# Patient Record
Sex: Female | Born: 1950 | Race: White | Hispanic: No | Marital: Married | Smoking: Never smoker
Health system: Southern US, Community
[De-identification: ages and names within clinical notes are randomized; demographics above are authoritative.]

## PROBLEM LIST (undated history)

## (undated) DIAGNOSIS — K5790 Diverticulosis of intestine, part unspecified, without perforation or abscess without bleeding: Secondary | ICD-10-CM

## (undated) DIAGNOSIS — K5792 Diverticulitis of intestine, part unspecified, without perforation or abscess without bleeding: Secondary | ICD-10-CM

## (undated) DIAGNOSIS — K219 Gastro-esophageal reflux disease without esophagitis: Secondary | ICD-10-CM

## (undated) DIAGNOSIS — G500 Trigeminal neuralgia: Secondary | ICD-10-CM

## (undated) DIAGNOSIS — K829 Disease of gallbladder, unspecified: Secondary | ICD-10-CM

## (undated) DIAGNOSIS — E669 Obesity, unspecified: Secondary | ICD-10-CM

## (undated) HISTORY — PX: TUBAL LIGATION: SHX77

---

## 2005-12-13 ENCOUNTER — Ambulatory Visit: Payer: Self-pay

## 2006-02-28 ENCOUNTER — Ambulatory Visit: Payer: Self-pay | Admitting: Unknown Physician Specialty

## 2006-12-16 ENCOUNTER — Ambulatory Visit: Payer: Self-pay

## 2007-12-20 ENCOUNTER — Ambulatory Visit: Payer: Self-pay

## 2008-12-25 ENCOUNTER — Ambulatory Visit: Payer: Self-pay

## 2009-11-08 ENCOUNTER — Emergency Department: Payer: Self-pay | Admitting: Emergency Medicine

## 2010-01-14 ENCOUNTER — Ambulatory Visit: Payer: Self-pay

## 2011-01-20 ENCOUNTER — Ambulatory Visit: Payer: Self-pay

## 2012-01-26 ENCOUNTER — Ambulatory Visit: Payer: Self-pay

## 2013-02-14 ENCOUNTER — Ambulatory Visit: Payer: Self-pay

## 2014-03-25 ENCOUNTER — Ambulatory Visit: Payer: Self-pay

## 2016-05-04 ENCOUNTER — Other Ambulatory Visit: Payer: Self-pay | Admitting: Obstetrics and Gynecology

## 2016-05-04 DIAGNOSIS — Z1231 Encounter for screening mammogram for malignant neoplasm of breast: Secondary | ICD-10-CM

## 2016-05-21 ENCOUNTER — Ambulatory Visit: Payer: Self-pay

## 2016-06-16 ENCOUNTER — Other Ambulatory Visit: Payer: Self-pay | Admitting: Obstetrics and Gynecology

## 2016-06-16 ENCOUNTER — Ambulatory Visit
Admission: RE | Admit: 2016-06-16 | Discharge: 2016-06-16 | Disposition: A | Discharge status: Home / Self Care | Payer: BC Managed Care – PPO | Source: Ambulatory Visit | Attending: Obstetrics and Gynecology | Admitting: Obstetrics and Gynecology

## 2016-06-16 DIAGNOSIS — Z1231 Encounter for screening mammogram for malignant neoplasm of breast: Secondary | ICD-10-CM | POA: Diagnosis not present

## 2017-06-06 ENCOUNTER — Encounter (HOSPITAL_COMMUNITY): Payer: Self-pay | Admitting: Emergency Medicine

## 2017-06-06 DIAGNOSIS — K449 Diaphragmatic hernia without obstruction or gangrene: Secondary | ICD-10-CM | POA: Diagnosis not present

## 2017-06-06 DIAGNOSIS — K828 Other specified diseases of gallbladder: Secondary | ICD-10-CM | POA: Diagnosis not present

## 2017-06-06 DIAGNOSIS — N281 Cyst of kidney, acquired: Secondary | ICD-10-CM | POA: Insufficient documentation

## 2017-06-06 DIAGNOSIS — K5730 Diverticulosis of large intestine without perforation or abscess without bleeding: Secondary | ICD-10-CM | POA: Diagnosis not present

## 2017-06-06 DIAGNOSIS — Z79899 Other long term (current) drug therapy: Secondary | ICD-10-CM | POA: Diagnosis not present

## 2017-06-06 DIAGNOSIS — Z6841 Body Mass Index (BMI) 40.0 and over, adult: Secondary | ICD-10-CM | POA: Insufficient documentation

## 2017-06-06 DIAGNOSIS — R509 Fever, unspecified: Secondary | ICD-10-CM | POA: Insufficient documentation

## 2017-06-06 DIAGNOSIS — G500 Trigeminal neuralgia: Secondary | ICD-10-CM | POA: Insufficient documentation

## 2017-06-06 DIAGNOSIS — I700 Atherosclerosis of aorta: Secondary | ICD-10-CM | POA: Diagnosis not present

## 2017-06-06 DIAGNOSIS — K219 Gastro-esophageal reflux disease without esophagitis: Secondary | ICD-10-CM | POA: Diagnosis not present

## 2017-06-06 DIAGNOSIS — E669 Obesity, unspecified: Secondary | ICD-10-CM | POA: Diagnosis not present

## 2017-06-06 DIAGNOSIS — K5712 Diverticulitis of small intestine without perforation or abscess without bleeding: Secondary | ICD-10-CM | POA: Diagnosis not present

## 2017-06-06 DIAGNOSIS — M4316 Spondylolisthesis, lumbar region: Secondary | ICD-10-CM | POA: Insufficient documentation

## 2017-06-06 LAB — CBC
HEMATOCRIT: 37.4 % (ref 36.0–46.0)
HEMOGLOBIN: 12.5 g/dL (ref 12.0–15.0)
MCH: 29.5 pg (ref 26.0–34.0)
MCHC: 33.4 g/dL (ref 30.0–36.0)
MCV: 88.2 fL (ref 78.0–100.0)
Platelets: 314 10*3/uL (ref 150–400)
RBC: 4.24 MIL/uL (ref 3.87–5.11)
RDW: 13.8 % (ref 11.5–15.5)
WBC: 14.4 10*3/uL — AB (ref 4.0–10.5)

## 2017-06-06 LAB — COMPREHENSIVE METABOLIC PANEL
ALT: 13 U/L — ABNORMAL LOW (ref 14–54)
ANION GAP: 6 (ref 5–15)
AST: 17 U/L (ref 15–41)
Albumin: 3.4 g/dL — ABNORMAL LOW (ref 3.5–5.0)
Alkaline Phosphatase: 68 U/L (ref 38–126)
BILIRUBIN TOTAL: 0.5 mg/dL (ref 0.3–1.2)
BUN: 11 mg/dL (ref 6–20)
CO2: 27 mmol/L (ref 22–32)
Calcium: 9.1 mg/dL (ref 8.9–10.3)
Chloride: 103 mmol/L (ref 101–111)
Creatinine, Ser: 0.92 mg/dL (ref 0.44–1.00)
GFR calc Af Amer: >60 mL/min (ref >60)
Glucose, Bld: 131 mg/dL — ABNORMAL HIGH (ref 65–99)
POTASSIUM: 4.4 mmol/L (ref 3.5–5.1)
Sodium: 136 mmol/L (ref 135–145)
TOTAL PROTEIN: 6.6 g/dL (ref 6.5–8.1)

## 2017-06-06 LAB — URINALYSIS, ROUTINE W REFLEX MICROSCOPIC
Bilirubin Urine: NEGATIVE
Glucose, UA: NEGATIVE mg/dL
Hgb urine dipstick: NEGATIVE
KETONES UR: NEGATIVE mg/dL
LEUKOCYTES UA: NEGATIVE
NITRITE: NEGATIVE
PH: 7 (ref 5.0–8.0)
Protein, ur: NEGATIVE mg/dL
SPECIFIC GRAVITY, URINE: 1.011 (ref 1.005–1.030)

## 2017-06-06 LAB — LIPASE, BLOOD: LIPASE: 24 U/L (ref 11–51)

## 2017-06-06 MED ORDER — ACETAMINOPHEN 325 MG PO TABS
ORAL_TABLET | ORAL | Status: AC
  Filled 2017-06-06: qty 2

## 2017-06-06 MED ORDER — ACETAMINOPHEN 325 MG PO TABS
650.0000 mg | ORAL_TABLET | Freq: Once | ORAL | Status: AC
  Administered 2017-06-06: 650 mg via ORAL

## 2017-06-06 NOTE — ED Triage Notes (Signed)
Patient reports RUQ pain onset this week with emesis , diagnosed with gallbladder [facility] at [facility] , febrile at arrival , denies diarrhea .

## 2017-06-07 ENCOUNTER — Emergency Department (HOSPITAL_COMMUNITY): Payer: BC Managed Care – PPO

## 2017-06-07 ENCOUNTER — Encounter (HOSPITAL_COMMUNITY): Payer: Self-pay | Admitting: Internal Medicine

## 2017-06-07 ENCOUNTER — Observation Stay (HOSPITAL_COMMUNITY)
Admission: EM | Admit: 2017-06-07 | Discharge: 2017-06-08 | Disposition: A | Discharge status: Home / Self Care | Payer: BC Managed Care – PPO | Attending: Family Medicine | Admitting: Family Medicine

## 2017-06-07 DIAGNOSIS — R509 Fever, unspecified: Secondary | ICD-10-CM | POA: Diagnosis not present

## 2017-06-07 DIAGNOSIS — G500 Trigeminal neuralgia: Secondary | ICD-10-CM

## 2017-06-07 DIAGNOSIS — K5712 Diverticulitis of small intestine without perforation or abscess without bleeding: Secondary | ICD-10-CM

## 2017-06-07 DIAGNOSIS — K5792 Diverticulitis of intestine, part unspecified, without perforation or abscess without bleeding: Secondary | ICD-10-CM | POA: Diagnosis present

## 2017-06-07 DIAGNOSIS — E669 Obesity, unspecified: Secondary | ICD-10-CM | POA: Diagnosis present

## 2017-06-07 DIAGNOSIS — K5790 Diverticulosis of intestine, part unspecified, without perforation or abscess without bleeding: Secondary | ICD-10-CM | POA: Diagnosis present

## 2017-06-07 DIAGNOSIS — K829 Disease of gallbladder, unspecified: Secondary | ICD-10-CM | POA: Diagnosis present

## 2017-06-07 DIAGNOSIS — K219 Gastro-esophageal reflux disease without esophagitis: Secondary | ICD-10-CM | POA: Diagnosis present

## 2017-06-07 HISTORY — DX: Diverticulosis of intestine, part unspecified, without perforation or abscess without bleeding: K57.90

## 2017-06-07 HISTORY — DX: Obesity, unspecified: E66.9

## 2017-06-07 HISTORY — DX: Disease of gallbladder, unspecified: K82.9

## 2017-06-07 HISTORY — DX: Diverticulitis of intestine, part unspecified, without perforation or abscess without bleeding: K57.92

## 2017-06-07 HISTORY — DX: Gastro-esophageal reflux disease without esophagitis: K21.9

## 2017-06-07 HISTORY — DX: Trigeminal neuralgia: G50.0

## 2017-06-07 LAB — HIV ANTIBODY (ROUTINE TESTING W REFLEX): HIV SCREEN 4TH GENERATION: NONREACTIVE

## 2017-06-07 MED ORDER — LATANOPROST 0.005 % OP SOLN
1.0000 [drp] | Freq: Every day | OPHTHALMIC | Status: DC
  Administered 2017-06-07: 1 [drp] via OPHTHALMIC
  Filled 2017-06-07: qty 2.5

## 2017-06-07 MED ORDER — SODIUM CHLORIDE 0.9 % IV SOLN
INTRAVENOUS | Status: DC
  Administered 2017-06-07: 07:00:00 via INTRAVENOUS

## 2017-06-07 MED ORDER — CARBAMAZEPINE 200 MG PO TABS
200.0000 mg | ORAL_TABLET | Freq: Every evening | ORAL | Status: DC
  Administered 2017-06-07: 200 mg via ORAL
  Filled 2017-06-07: qty 1

## 2017-06-07 MED ORDER — ONDANSETRON HCL 4 MG/2ML IJ SOLN: 4.0000 mg | Freq: Four times a day (QID) | INTRAMUSCULAR | Status: DC | PRN

## 2017-06-07 MED ORDER — SODIUM CHLORIDE 0.9 % IV BOLUS (SEPSIS)
1000.0000 mL | Freq: Once | INTRAVENOUS | Status: AC
  Administered 2017-06-07: 1000 mL via INTRAVENOUS

## 2017-06-07 MED ORDER — KETOROLAC TROMETHAMINE 15 MG/ML IJ SOLN: 15.0000 mg | Freq: Four times a day (QID) | INTRAMUSCULAR | Status: DC | PRN

## 2017-06-07 MED ORDER — CIPROFLOXACIN IN D5W 400 MG/200ML IV SOLN: 400.0000 mg | Freq: Once | INTRAVENOUS | Status: DC

## 2017-06-07 MED ORDER — OXYCODONE HCL 5 MG PO TABS: 5.0000 mg | ORAL_TABLET | ORAL | Status: DC | PRN

## 2017-06-07 MED ORDER — METRONIDAZOLE IN NACL 5-0.79 MG/ML-% IV SOLN
500.0000 mg | Freq: Once | INTRAVENOUS | Status: AC
  Administered 2017-06-07: 500 mg via INTRAVENOUS
  Filled 2017-06-07: qty 100

## 2017-06-07 MED ORDER — DEXTROSE 5 % IV SOLN
2.0000 g | Freq: Once | INTRAVENOUS | Status: AC
  Administered 2017-06-07: 2 g via INTRAVENOUS
  Filled 2017-06-07: qty 2

## 2017-06-07 MED ORDER — METRONIDAZOLE IN NACL 5-0.79 MG/ML-% IV SOLN
500.0000 mg | Freq: Three times a day (TID) | INTRAVENOUS | Status: DC
  Administered 2017-06-07 – 2017-06-08 (×4): 500 mg via INTRAVENOUS
  Filled 2017-06-07 (×6): qty 100

## 2017-06-07 MED ORDER — ONDANSETRON HCL 4 MG/2ML IJ SOLN
4.0000 mg | Freq: Once | INTRAMUSCULAR | Status: AC
  Administered 2017-06-07: 4 mg via INTRAVENOUS
  Filled 2017-06-07: qty 2

## 2017-06-07 MED ORDER — FENTANYL CITRATE (PF) 100 MCG/2ML IJ SOLN
50.0000 ug | Freq: Once | INTRAMUSCULAR | Status: AC
  Administered 2017-06-07: 50 ug via INTRAVENOUS
  Filled 2017-06-07: qty 2

## 2017-06-07 MED ORDER — ENOXAPARIN SODIUM 40 MG/0.4ML ~~LOC~~ SOLN
40.0000 mg | SUBCUTANEOUS | Status: DC
  Administered 2017-06-07 – 2017-06-08 (×2): 40 mg via SUBCUTANEOUS
  Filled 2017-06-07 (×2): qty 0.4

## 2017-06-07 MED ORDER — IOPAMIDOL (ISOVUE-300) INJECTION 61%
INTRAVENOUS | Status: AC
  Administered 2017-06-07: 100 mL
  Filled 2017-06-07: qty 100

## 2017-06-07 MED ORDER — ACETAMINOPHEN 325 MG PO TABS
650.0000 mg | ORAL_TABLET | Freq: Four times a day (QID) | ORAL | Status: DC | PRN
  Administered 2017-06-07 (×2): 650 mg via ORAL
  Filled 2017-06-07 (×2): qty 2

## 2017-06-07 MED ORDER — SODIUM CHLORIDE 0.9 % IV BOLUS (SEPSIS)
1000.0000 mL | Freq: Once | INTRAVENOUS | Status: AC
Start: 2017-06-07 — End: 2017-06-07
  Administered 2017-06-07: 1000 mL via INTRAVENOUS

## 2017-06-07 MED ORDER — DEXTROSE 5 % IV SOLN
2.0000 g | INTRAVENOUS | Status: DC
  Administered 2017-06-07: 2 g via INTRAVENOUS
  Filled 2017-06-07: qty 2

## 2017-06-07 MED ORDER — ONDANSETRON HCL 4 MG PO TABS: 4.0000 mg | ORAL_TABLET | Freq: Four times a day (QID) | ORAL | Status: DC | PRN

## 2017-06-07 NOTE — Plan of Care (Signed)
Received signout from Dr. Lynelle DoctorKnapp. Ms. Joy Mclaughlin is a 66 year old female with pmh of gallbladder disease; who presented with complaints of abd pain, nausea, vomiting, and fever 101.72F. WBC 14.4. CT scan of the abdomen showing diverticulitis of the small bowel. Patient initially given Rocephin and metronidazole IV. Ciprofloxacin added on following CT scan results. Patient also received 2 L of normal saline IV fluids, Zofran, Tylenol, and fentanyl for pain. Admitted as observation to a MedSurg bed.

## 2017-06-07 NOTE — Progress Notes (Signed)
Pharmacy Antibiotic Note  Joy JohnConnie Mclaughlin is a 66 y.o. female admitted on 06/07/2017 with intra-abdominal infection.  Pharmacy has been consulted for Ceftriaxone dosing. WBC elevated.   Plan: -Ceftriaxone 2g IV q24h -Flagyl per MD -Trend WBC, temp -F/U infectious work-up  Height: 5\' 2"  (157.5 cm) Weight: 227 lb 8.2 oz (103.2 kg) IBW/kg (Calculated) : 50.1  Temp (24hrs), Avg:100.8 F (38.2 C), Min:99.7 F (37.6 C), Max:101.7 F (38.7 C)   Recent Labs Lab 06/06/17 2002  WBC 14.4*  CREATININE 0.92    Estimated Creatinine Clearance: 70.4 mL/min (by C-G formula based on SCr of 0.92 mg/dL).    No Known Allergies  Joy Mclaughlin, Joy Mclaughlin 06/07/2017 6:58 AM

## 2017-06-07 NOTE — H&P (Signed)
History and Physical    Joy Mclaughlin ZOX:[id number] DOB: [birth year] DOA: 06/07/2017  PCP: [facility]) Patient [place]  Chief Complaint: abdominal pain  HPI: Joy Mclaughlin is a very pleasant 65 y.o. female with medical history significant GERD, trigeminal neuralgia, diverticulosis, gall bladder disease this emergency department chief complaint persistent worsening abdominal pain. Initial evaluation includes CT of the abdomen pelvis revealing diverticulitis.  Information is obtained from the patient and the chart. She states she was in her usual state of health just returning from a [address] trip vacation she developed sudden right upper quadrant pain. She was evaluated at [facility] and told that there was mild sludge in her gallbladder. She was provided with oral antibiotics and discharged. MRI with MRCP scheduled for tomorrow. He was also instructed that if she spikes a temperature we'll refer pain got worse she needed to go to a bigger hospital. She said yesterday she was very tired she ate very little around 6 PM became feverish and had a chill. She reports her temperature was 101.9 associated symptoms include nausea without vomiting. She reports initially the pain was in her upper quadrants by the time she came to emergency department it was more in her lower quadrants. She describes a constant sharp aching. Nothing makes it better or worse. She denies headache dizziness syncope or near-syncope. She denies chest pain palpitations shortness of breath diaphoresis cough. She denies dysuria hematuria frequency or urgency.    ED Course: In the emergency department her max temperature is 100.9 she's hemodynamically stable and not hypoxic. She is provided with 3 L of normal saline Cipro and Flagyl and Rocephin. At the time of my exam she reports no nausea and is pain-free  Review of Systems: As per HPI otherwise all other systems reviewed and are  negative.   Ambulatory Status: Ambulates independently. Is independent with ADLs  Past Medical History:  Diagnosis Date  . Diverticulitis   . Diverticulosis   . Gallbladder disease   . GERD (gastroesophageal reflux disease)   . Obesity   . Trigeminal neuralgia     Past Surgical History:  Procedure Laterality Date  . TUBAL LIGATION      Social History   Social History  . Marital status: Married    Spouse name: N/A  . Number of children: N/A  . Years of education: N/A   Occupational History  . Not on file.   Social History Main Topics  . Smoking status: Never Smoker  . Smokeless tobacco: Not on file  . Alcohol use No  . Drug use: No  . Sexual activity: Not on file   Other Topics Concern  . Not on file   Social History Narrative  . No narrative on file    No Known Allergies  Family History  Problem Relation Age of Onset  . Asthma Mother   . Diabetes Mother   . Hypertension Mother   . Hypertension Father     Prior to Admission medications   Medication Sig Start Date End Date Taking? Authorizing Provider  carbamazepine (TEGRETOL) 200 MG tablet Take 200 mg by mouth every evening.   Yes [provider]  Coenzyme Q10 (COQ10) 200 MG CAPS Take 200 mg by mouth every Tuesday, Thursday, and Saturday at 6 PM.   Yes [provider]  Cyanocobalamin (VITAMIN B-12 PO) Take 1 tablet by mouth daily.   Yes [provider]  latanoprost (XALATAN) 0.005 % ophthalmic solution Place 1  drop into both eyes at bedtime.   Yes [provider]  Multiple Vitamin (MULTIVITAMIN WITH MINERALS) TABS tablet Take 1 tablet by mouth every Monday, Wednesday, and Friday.   Yes [provider]  omega-3 acid ethyl esters (LOVAZA) 1 g capsule Take 1 g by mouth every Monday, Wednesday, and Friday.   Yes [provider]  omeprazole (PRILOSEC) 20 MG capsule Take 20 mg by mouth daily.   Yes [provider]  oxyCODONE (OXY IR/ROXICODONE) 5 MG  immediate release tablet Take 5 mg by mouth every 4 (four) hours as needed for moderate pain.  06/06/17  Yes [provider]  Red Yeast Rice Extract (RED YEAST RICE PO) Take 1,200 mg by mouth every Tuesday, Thursday, and Saturday at 6 PM.   Yes [provider]    Physical Exam: Vitals:   06/07/17 0345 06/07/17 0430 06/07/17 0545 06/07/17 0611  BP: 129/62 (!) 141/74 (!) 154/76 (!) 123/49  Pulse: 83 79 77 81  Resp:  18 18 18   Temp:    (!) 100.9 F (38.3 C)  TempSrc:    Oral  SpO2: 92% 94% 93% 99%  Weight:    103.2 kg (227 lb 8.2 oz)  Height:    5\' 2"  (1.575 m)     General:  Appears calm and comfortable In no acute distress Eyes:  PERRL, EOMI, normal lids, iris ENT:  grossly normal hearing, lips & tongue, mucous membranes of her mouth are somewhat dry but pink Neck:  no LAD, masses or thyromegaly Cardiovascular:  RRR, no m/r/g. No LE edema.  Respiratory:  CTA bilaterally, no w/r/r. Normal respiratory effort. Abdomen:  Soft, mild diffuse tenderness particularly in the lower quadrants no guarding or rebounding bowel sounds very sluggish particularly in the lower quadrants Skin:  no rash or induration seen on limited exam Musculoskeletal:  grossly normal tone BUE/BLE, good ROM, no bony abnormality Psychiatric:  grossly normal mood and affect, speech fluent and appropriate, AOx3 Neurologic:  CN 2-12 grossly intact, moves all extremities in coordinated fashion, sensation intact  Labs on Admission: I have personally reviewed following labs and imaging studies  CBC:  Recent Labs Lab 06/06/17 2002  WBC 14.4*  HGB 12.5  HCT 37.4  MCV 88.2  PLT 314   Basic Metabolic Panel:  Recent Labs Lab 06/06/17 2002  NA 136  K 4.4  CL 103  CO2 27  GLUCOSE 131*  BUN 11  CREATININE 0.92  CALCIUM 9.1   GFR: Estimated Creatinine Clearance: 70.4 mL/min (by C-G formula based on SCr of 0.92 mg/dL). Liver Function Tests:  Recent Labs Lab 06/06/17 2002  AST 17  ALT 13*   ALKPHOS 68  BILITOT 0.5  PROT 6.6  ALBUMIN 3.4*    Recent Labs Lab 06/06/17 2002  LIPASE 24   No results for input(s): AMMONIA in the last 168 hours. Coagulation Profile: No results for input(s): INR, PROTIME in the last 168 hours. Cardiac Enzymes: No results for input(s): CKTOTAL, CKMB, CKMBINDEX, TROPONINI in the last 168 hours. BNP (last 3 results) No results for input(s): PROBNP in the last 8760 hours. HbA1C: No results for input(s): HGBA1C in the last 72 hours. CBG: No results for input(s): GLUCAP in the last 168 hours. Lipid Profile: No results for input(s): CHOL, HDL, LDLCALC, TRIG, CHOLHDL, LDLDIRECT in the last 72 hours. Thyroid Function Tests: No results for input(s): TSH, T4TOTAL, FREET4, T3FREE, THYROIDAB in the last 72 hours. Anemia Panel: No results for input(s): VITAMINB12, FOLATE, FERRITIN, TIBC, IRON,  RETICCTPCT in the last 72 hours. Urine analysis:    Component Value Date/Time   COLORURINE YELLOW 06/06/2017 2000   APPEARANCEUR CLEAR 06/06/2017 2000   LABSPEC 1.011 06/06/2017 2000   PHURINE 7.0 06/06/2017 2000   GLUCOSEU NEGATIVE 06/06/2017 2000   HGBUR NEGATIVE 06/06/2017 2000   BILIRUBINUR NEGATIVE 06/06/2017 2000   KETONESUR NEGATIVE 06/06/2017 2000   PROTEINUR NEGATIVE 06/06/2017 2000   NITRITE NEGATIVE 06/06/2017 2000   LEUKOCYTESUR NEGATIVE 06/06/2017 2000    Creatinine Clearance: Estimated Creatinine Clearance: 70.4 mL/min (by C-G formula based on SCr of 0.92 mg/dL).  Sepsis Labs: @LABRCNTIP (procalcitonin:4,lacticidven:4) )No results found for this or any previous visit (from the past 240 hour(s)).   Radiological Exams on Admission: Ct Abdomen Pelvis W Contrast  Result Date: 06/07/2017 CLINICAL DATA:  65 year old female with right-sided abdominal pain and vomiting. EXAM: CT ABDOMEN AND PELVIS WITH CONTRAST TECHNIQUE: Multidetector CT imaging of the abdomen and pelvis was performed using the standard protocol following bolus  administration of intravenous contrast. CONTRAST:  ISOVUE-300 IOPAMIDOL (ISOVUE-300) INJECTION 61% COMPARISON:  None. FINDINGS: Lower chest: The visualized lung bases are clear. No intra-abdominal free air or free fluid. Hepatobiliary: The liver is unremarkable. The gallbladder is distended. No pericholecystic fluid. No calcified gallstone. Pancreas: Unremarkable. No pancreatic ductal dilatation or surrounding inflammatory changes. Spleen: Normal in size without focal abnormality. Adrenals/Urinary Tract: The adrenal glands are unremarkable. There are small bilateral renal parapelvic cysts. There is no hydronephrosis on either side. There is symmetric enhancement and excretion of contrast by kidneys. The visualized ureters and urinary bladder appear unremarkable. Stomach/Bowel: There is a moderate size hiatal hernia. There are multiple duodenal diverticulum measuring up to 2.2 cm at the head of the pancreas no active inflammatory changes. There is inflammatory changes of the loops of small bowel in the mid abdomen. Multiple small bowel diverticula noted in this inflamed loops of small bowel. The inflammatory changes appear to arise from a small bowel diverticulum in the mid abdomen (series 3, image 54 and coronal series 6, image 51). There are scattered colonic diverticula without active inflammatory changes. There is no evidence of bowel obstruction. The appendix is normal. Vascular/Lymphatic: There is mild atherosclerotic calcification of the abdominal aorta. The origins of the celiac axis, SMA, and IMA appear patent. No portal venous gas identified. There is no adenopathy. The IVC appears unremarkable. Reproductive: The uterus is anteverted and grossly unremarkable as visualized. No pelvic mass. Other: None Musculoskeletal: There is degenerative changes of the spine with multilevel disc desiccation and vacuum phenomena. Grade 1 L4-5 anterolisthesis. No acute fracture. IMPRESSION: 1. Findings most consistent  with diverticulitis of the small bowel in the mid abdomen. No drainable fluid collection or abscess. 2. Colonic diverticulosis without active inflammatory changes. No bowel obstruction. Normal appendix. 3. Moderate size hiatal hernia. 4. Mildly distended gallbladder.  No calcified gallstone. Electronically Signed   By: Elgie Collard M.D.   On: 06/07/2017 02:47   US Abdomen Limited Ruq  Result Date: 06/07/2017 CLINICAL DATA:  66 year old female with right upper quadrant abdominal pain. EXAM: ULTRASOUND ABDOMEN LIMITED RIGHT UPPER QUADRANT COMPARISON:  Abdominal CT dated 06/07/2017 FINDINGS: Gallbladder: No gallstones or wall thickening visualized. No sonographic Murphy sign noted by sonographer. Common bile duct: Diameter: 3 mm Liver: No focal lesion identified. Within normal limits in parenchymal echogenicity. IMPRESSION: Unremarkable right upper quadrant ultrasound. Electronically Signed   By: Elgie Collard M.D.   On: 06/07/2017 03:10    EKG:   Assessment/Plan Principal Problem:   Diverticulitis Active  Problems:   Obesity   Diverticulosis   Gallbladder disease   GERD (gastroesophageal reflux disease)   #1. Diverticulitis. CT of the abdomen findings consistent with diverticulitis of small bowel no drainable fluid collection or abscess, no bowel obstruction mildly distended gallbladder no calcified gallstones. She has a fever, WBC 14.4. Ultrasound of right upper quadrant unremarkable. Patient provided with IV fluids and antibiotics in the emergency department -Continue Cipro and Flagyl -Clear liquid diet -Analgesia and antiemetic -Gentle IV fluids -Hopefully home in 24 hours on oral antibiotics  #2. Gallbladder disease/abnormal abdominal ultrasound at another facility. Patient had a right upper [facility] that revealed mild sludge. She was scheduled for a MRI with MRCP for tomorrow outpatient. Right upper quadrant ultrasound done today was  unremarkable. -Outpatient follow-up  #3. GERD. Stable at baseline -continue home meds    DVT prophylaxis: lovenox  Code Status: full  Family Communication: none present  Disposition Plan: home hopefully tomorrow  Consults called: none  Admission status: obs    Gwenyth BenderBLACK,Amylah Will M MD [facility]  If 7PM-7AM, please contact night-coverage [email or web address] Password [email or web address]  06/07/2017, 7:33 AM

## 2017-06-07 NOTE — ED Provider Notes (Signed)
[facility]-EMERGENCY DEPT Provider Note   CSN: [id number] Arrival date & time: 06/06/17  1933  By signing my name below, I, Diona BrownerJennifer Gorman, attest that this documentation has been prepared under the direction and in the presence of Devoria AlbeKnapp, Sherwin Hollingshed, MD. Electronically Signed: Diona BrownerJennifer Gorman, ED Scribe. 06/07/17. 1:08 AM.  TIME SEEN: [id number]  History   Chief Complaint Chief Complaint  Patient presents with  . Abdominal Pain    Gallbladder Disease    HPI Joy Mclaughlin is a 66 y.o. female with a PMHx of gallbladder disease, who presents to the Emergency Department complaining of RUQ pain that started ~7:30 pm on Sunday, 06/05/17. Pt reports pain started after eating a pot pie and ice cream. Associated sx include fever (101.9), emesis (yesterday), chills, and fatigue. Pain has improved with pain medication, but is still present and radiates to her right back. Pt went to [facility] when onset first started and was Was violated with an ultrasound which showed gallbladder sludge. Her white blood cell count was 14,000. She was discharged home on July 30 with pain medication and MRI MRCP scheduled for Saturday, 06/11/17. She was told that if her sx worsened or if she became febrile to report to a bigger hospital. Pt slept a lot today, ate a few bites of a salad and noticed a temperature ~ 6 pm of 101.9. Both parents have had their gallbladders removed. Doesn't smoke. Drinks occasionally. Pt denies abdominal bloating, and diarrhea. She denies nausea or vomiting today although she did have them when her symptoms first started. She denies cough or shortness of breath. She states she did not eat today.  PCP: Clydie BraunFitzgerald, David [facility]([place])   The history is provided by the patient. No language interpreter was used.    Past Medical History:  Diagnosis Date  . Gallbladder disease   . Trigeminal neuralgia     There are no active problems to display for this patient.   Past Surgical History:  Procedure  Laterality Date  . TUBAL LIGATION      OB History    No data available       Home Medications    Prior to Admission medications   Medication Sig Start Date End Date Taking? Authorizing Provider  carbamazepine (TEGRETOL) 200 MG tablet Take 200 mg by mouth every evening.   Yes [provider]  Coenzyme Q10 (COQ10) 200 MG CAPS Take 200 mg by mouth every Tuesday, Thursday, and Saturday at 6 PM.   Yes [provider]  Cyanocobalamin (VITAMIN B-12 PO) Take 1 tablet by mouth daily.   Yes [provider]  latanoprost (XALATAN) 0.005 % ophthalmic solution Place 1 drop into both eyes at bedtime.   Yes [provider]  Multiple Vitamin (MULTIVITAMIN WITH MINERALS) TABS tablet Take 1 tablet by mouth every Monday, Wednesday, and Friday.   Yes [provider]  omega-3 acid ethyl esters (LOVAZA) 1 g capsule Take 1 g by mouth every Monday, Wednesday, and Friday.   Yes [provider]  omeprazole (PRILOSEC) 20 MG capsule Take 20 mg by mouth daily.   Yes [provider]  oxyCODONE (OXY IR/ROXICODONE) 5 MG immediate release tablet Take 5 mg by mouth every 4 (four) hours as needed for moderate pain.  06/06/17  Yes [provider]  Red Yeast Rice Extract (RED YEAST RICE PO) Take 1,200 mg by mouth every Tuesday, Thursday, and Saturday at 6 PM.   Yes [provider]    Family History No family history  on file.  Social History Social History  Substance Use Topics  . Smoking status: Never Smoker  . Smokeless tobacco: Not on file  . Alcohol use No  retired   Allergies   Patient has no known allergies.   Review of Systems Review of Systems  Constitutional: Positive for chills, fatigue and fever.  Gastrointestinal: Positive for abdominal pain and vomiting. Negative for diarrhea.  All other systems reviewed and are negative.    Physical Exam Updated Vital Signs BP 133/66 (BP Location: Right Arm)   Pulse 91   Temp  (!) 101.7 F (38.7 C) (Oral)   Resp 17   Ht 5\' 2"  (1.575 m)   Wt 218 lb (98.9 kg)   SpO2 96%   BMI 39.87 kg/m   Vital signs normal    Physical Exam  Constitutional: She is oriented to person, place, and time. She appears well-developed and well-nourished.  Non-toxic appearance. She does not appear ill. No distress.  HENT:  Head: Normocephalic and atraumatic.  Right Ear: External ear normal.  Left Ear: External ear normal.  Nose: Nose normal. No mucosal edema or rhinorrhea.  Mouth/Throat: Oropharynx is clear and moist and mucous membranes are normal. No dental abscesses or uvula swelling.  Eyes: Pupils are equal, round, and reactive to light. Conjunctivae and EOM are normal.  Neck: Normal range of motion and full passive range of motion without pain. Neck supple.  Cardiovascular: Normal rate, regular rhythm and normal heart sounds.  Exam reveals no gallop and no friction rub.   No murmur heard. Pulmonary/Chest: Effort normal and breath sounds normal. No respiratory distress. She has no wheezes. She has no rhonchi. She has no rales. She exhibits no tenderness and no crepitus.  Abdominal: Soft. Normal appearance and bowel sounds are normal. She exhibits no distension. There is tenderness in the right lower quadrant. There is no rebound and no guarding.    RLQ and right mid abdomen tenderness. She does not have tenderness in the right upper quadrant.  Musculoskeletal: Normal range of motion. She exhibits no edema or tenderness.  Moves all extremities well.   Neurological: She is alert and oriented to person, place, and time. She has normal strength. No cranial nerve deficit.  Skin: Skin is warm, dry and intact. No rash noted. No erythema. No pallor.  Psychiatric: She has a normal mood and affect. Her speech is normal and behavior is normal. Her mood appears not anxious.  Nursing note and vitals reviewed.    ED Treatments / Results  Labs (all labs ordered are listed, but only  abnormal results are displayed) Results for orders placed or performed during the hospital encounter of 06/07/17  Lipase, blood  Result Value Ref Range   Lipase 24 11 - 51 U/L  Comprehensive metabolic panel  Result Value Ref Range   Sodium 136 135 - 145 mmol/L   Potassium 4.4 3.5 - 5.1 mmol/L   Chloride 103 101 - 111 mmol/L   CO2 27 22 - 32 mmol/L   Glucose, Bld 131 (H) 65 - 99 mg/dL   BUN 11 6 - 20 mg/dL   Creatinine, Ser 4.09 0.44 - 1.00 mg/dL   Calcium 9.1 8.9 - 81.1 mg/dL   Total Protein 6.6 6.5 - 8.1 g/dL   Albumin 3.4 (L) 3.5 - 5.0 g/dL   AST 17 15 - 41 U/L   ALT 13 (L) 14 - 54 U/L   Alkaline Phosphatase 68 38 - 126 U/L   Total Bilirubin 0.5 0.3 -  1.2 mg/dL   GFR calc non Af Amer >60 >60 mL/min   GFR calc Af Amer >60 >60 mL/min   Anion gap 6 5 - 15  CBC  Result Value Ref Range   WBC 14.4 (H) 4.0 - 10.5 K/uL   RBC 4.24 3.87 - 5.11 MIL/uL   Hemoglobin 12.5 12.0 - 15.0 g/dL   HCT 40.9 81.1 - 91.4 %   MCV 88.2 78.0 - 100.0 fL   MCH 29.5 26.0 - 34.0 pg   MCHC 33.4 30.0 - 36.0 g/dL   RDW 78.2 95.6 - 21.3 %   Platelets 314 150 - 400 K/uL  Urinalysis, Routine w reflex microscopic  Result Value Ref Range   Color, Urine YELLOW YELLOW   APPearance CLEAR CLEAR   Specific Gravity, Urine 1.011 1.005 - 1.030   pH 7.0 5.0 - 8.0   Glucose, UA NEGATIVE NEGATIVE mg/dL   Hgb urine dipstick NEGATIVE NEGATIVE   Bilirubin Urine NEGATIVE NEGATIVE   Ketones, ur NEGATIVE NEGATIVE mg/dL   Protein, ur NEGATIVE NEGATIVE mg/dL   Nitrite NEGATIVE NEGATIVE   Leukocytes, UA NEGATIVE NEGATIVE   Laboratory interpretation all normal except For leukocytosis    EKG  EKG Interpretation None       Radiology Ct Abdomen Pelvis W Contrast  Result Date: 06/07/2017 CLINICAL DATA:  66 year old female with right-sided abdominal pain and vomiting. EXAM: CT ABDOMEN AND PELVIS WITH CONTRAST TECHNIQUE: Multidetector CT imaging of the abdomen and pelvis was performed using the standard protocol  following bolus administration of intravenous contrast. CONTRAST:  ISOVUE-300 IOPAMIDOL (ISOVUE-300) INJECTION 61% COMPARISON:  None. FINDINGS: Lower chest: The visualized lung bases are clear. No intra-abdominal free air or free fluid. Hepatobiliary: The liver is unremarkable. The gallbladder is distended. No pericholecystic fluid. No calcified gallstone. Pancreas: Unremarkable. No pancreatic ductal dilatation or surrounding inflammatory changes. Spleen: Normal in size without focal abnormality. Adrenals/Urinary Tract: The adrenal glands are unremarkable. There are small bilateral renal parapelvic cysts. There is no hydronephrosis on either side. There is symmetric enhancement and excretion of contrast by kidneys. The visualized ureters and urinary bladder appear unremarkable. Stomach/Bowel: There is a moderate size hiatal hernia. There are multiple duodenal diverticulum measuring up to 2.2 cm at the head of the pancreas no active inflammatory changes. There is inflammatory changes of the loops of small bowel in the mid abdomen. Multiple small bowel diverticula noted in this inflamed loops of small bowel. The inflammatory changes appear to arise from a small bowel diverticulum in the mid abdomen (series 3, image 54 and coronal series 6, image 51). There are scattered colonic diverticula without active inflammatory changes. There is no evidence of bowel obstruction. The appendix is normal. Vascular/Lymphatic: There is mild atherosclerotic calcification of the abdominal aorta. The origins of the celiac axis, SMA, and IMA appear patent. No portal venous gas identified. There is no adenopathy. The IVC appears unremarkable. Reproductive: The uterus is anteverted and grossly unremarkable as visualized. No pelvic mass. Other: None Musculoskeletal: There is degenerative changes of the spine with multilevel disc desiccation and vacuum phenomena. Grade 1 L4-5 anterolisthesis. No acute fracture. IMPRESSION: 1. Findings  most consistent with diverticulitis of the small bowel in the mid abdomen. No drainable fluid collection or abscess. 2. Colonic diverticulosis without active inflammatory changes. No bowel obstruction. Normal appendix. 3. Moderate size hiatal hernia. 4. Mildly distended gallbladder.  No calcified gallstone. Electronically Signed   By: Elgie Collard M.D.   On: 06/07/2017 02:47   US Abdomen Limited Ruq  Result Date: 06/07/2017 CLINICAL DATA:  66 year old female with right upper quadrant abdominal pain. EXAM: ULTRASOUND ABDOMEN LIMITED RIGHT UPPER QUADRANT COMPARISON:  Abdominal CT dated 06/07/2017 FINDINGS: Gallbladder: No gallstones or wall thickening visualized. No sonographic Murphy sign noted by sonographer. Common bile duct: Diameter: 3 mm Liver: No focal lesion identified. Within normal limits in parenchymal echogenicity. IMPRESSION: Unremarkable right upper quadrant ultrasound. Electronically Signed   By: Elgie CollardArash  Radparvar M.D.   On: 06/07/2017 03:10    Procedures Procedures (including critical care time)  Medications Ordered in ED Medications  acetaminophen (TYLENOL) 325 MG tablet (not administered)  ciprofloxacin (CIPRO) IVPB 400 mg (not administered)  acetaminophen (TYLENOL) tablet 650 mg (650 mg Oral Given 06/06/17 2007)  sodium chloride 0.9 % bolus 1,000 mL (0 mLs Intravenous Stopped 06/07/17 0329)  sodium chloride 0.9 % bolus 1,000 mL (1,000 mLs Intravenous New Bag/Given 06/07/17 0126)  fentaNYL (SUBLIMAZE) injection 50 mcg (50 mcg Intravenous Given 06/07/17 0125)  ondansetron (ZOFRAN) injection 4 mg (4 mg Intravenous Given 06/07/17 0123)  cefTRIAXone (ROCEPHIN) 2 g in dextrose 5 % 50 mL IVPB (0 g Intravenous Stopped 06/07/17 0230)    And  metroNIDAZOLE (FLAGYL) IVPB 500 mg (0 mg Intravenous Stopped 06/07/17 0226)  iopamidol (ISOVUE-300) 61 % injection (100 mLs  Contrast Given 06/07/17 0208)     Initial Impression / Assessment and Plan / ED Course  I have reviewed the triage vital  signs and the nursing notes.  Pertinent labs & imaging results that were available during my care of the patient were reviewed by me and considered in my medical decision making (see chart for details).  Clinical Course as of Jun 07 434  Tue Jun 07, 2017  0356 Pt is feeling better. Gallbladder test looked good. Pt has diverticulitis in her small bowel. We discussed pt being admitted to the hospital for at least 24 hours or going home as outpatient with oral antibiotics. She prefers to be admitted. Pt reports her mother has diverticulitis.   [JG]    Clinical Course User Index [JG] Diona BrownerGorman, Jennifer     DIAGNOSTIC STUDIES: Oxygen Saturation is 96% on RA, adequate by my interpretation.   COORDINATION OF CARE: 1:08 AM-Discussed next steps with pt which includes repeating an ultra sound and a Abdominal/pelvis CT scan. Pt will also be given antibiotics and IV fluids. Pt verbalized understanding and is agreeable with the plan. Patient's pain seems to be in the wrong location to be from her gallbladder, concern was for appendicitis or other type of bowel inflammation. She was given IV antibiotics for simple appendicitis while waiting for her radiology test results  04:34 AM Dr Arlyss Queen Smith, hospitalist, will admit.   Final Clinical Impressions(s) / ED Diagnoses   Final diagnoses:  Diverticulitis of small intestine without perforation or abscess without bleeding    Plan admission  Devoria AlbeIva Janashia Parco, MD, FACEP    I personally performed the services described in this documentation, which was scribed in my presence. The recorded information has been reviewed and considered.  Vital signs normal     Devoria AlbeKnapp, Shantese Raven, MD 06/07/17 [phone]

## 2017-06-08 DIAGNOSIS — K5792 Diverticulitis of intestine, part unspecified, without perforation or abscess without bleeding: Secondary | ICD-10-CM

## 2017-06-08 DIAGNOSIS — K829 Disease of gallbladder, unspecified: Secondary | ICD-10-CM | POA: Diagnosis not present

## 2017-06-08 DIAGNOSIS — K219 Gastro-esophageal reflux disease without esophagitis: Secondary | ICD-10-CM | POA: Diagnosis not present

## 2017-06-08 DIAGNOSIS — K5790 Diverticulosis of intestine, part unspecified, without perforation or abscess without bleeding: Secondary | ICD-10-CM

## 2017-06-08 DIAGNOSIS — G500 Trigeminal neuralgia: Secondary | ICD-10-CM | POA: Diagnosis not present

## 2017-06-08 LAB — COMPREHENSIVE METABOLIC PANEL
ALK PHOS: 54 U/L (ref 38–126)
ALT: 12 U/L — ABNORMAL LOW (ref 14–54)
ANION GAP: 7 (ref 5–15)
AST: 13 U/L — ABNORMAL LOW (ref 15–41)
Albumin: 2.5 g/dL — ABNORMAL LOW (ref 3.5–5.0)
BUN: 6 mg/dL (ref 6–20)
CALCIUM: 8.1 mg/dL — AB (ref 8.9–10.3)
CO2: 23 mmol/L (ref 22–32)
Chloride: 109 mmol/L (ref 101–111)
Creatinine, Ser: 0.81 mg/dL (ref 0.44–1.00)
GFR calc Af Amer: >60 mL/min (ref >60)
Glucose, Bld: 101 mg/dL — ABNORMAL HIGH (ref 65–99)
Potassium: 3.4 mmol/L — ABNORMAL LOW (ref 3.5–5.1)
SODIUM: 139 mmol/L (ref 135–145)
Total Bilirubin: 0.3 mg/dL (ref 0.3–1.2)
Total Protein: 5.7 g/dL — ABNORMAL LOW (ref 6.5–8.1)

## 2017-06-08 LAB — CBC
HCT: 32.5 % — ABNORMAL LOW (ref 36.0–46.0)
HEMOGLOBIN: 10.6 g/dL — AB (ref 12.0–15.0)
MCH: 28.8 pg (ref 26.0–34.0)
MCHC: 32.6 g/dL (ref 30.0–36.0)
MCV: 88.3 fL (ref 78.0–100.0)
Platelets: 237 10*3/uL (ref 150–400)
RBC: 3.68 MIL/uL — ABNORMAL LOW (ref 3.87–5.11)
RDW: 13.9 % (ref 11.5–15.5)
WBC: 8.5 10*3/uL (ref 4.0–10.5)

## 2017-06-08 MED ORDER — DOCUSATE SODIUM 100 MG PO CAPS: 100.0000 mg | ORAL_CAPSULE | Freq: Two times a day (BID) | ORAL | 0 refills | Status: AC

## 2017-06-08 MED ORDER — METRONIDAZOLE 500 MG PO TABS: 500.0000 mg | ORAL_TABLET | Freq: Three times a day (TID) | ORAL | 0 refills | Status: AC

## 2017-06-08 MED ORDER — POTASSIUM CHLORIDE CRYS ER 20 MEQ PO TBCR
40.0000 meq | EXTENDED_RELEASE_TABLET | Freq: Once | ORAL | Status: AC
  Administered 2017-06-08: 40 meq via ORAL
  Filled 2017-06-08: qty 2

## 2017-06-08 MED ORDER — CIPROFLOXACIN HCL 500 MG PO TABS: 500.0000 mg | ORAL_TABLET | Freq: Two times a day (BID) | ORAL | 0 refills | Status: AC

## 2017-06-08 NOTE — Discharge Instructions (Signed)
Follow with Primary MD  System, Pcp Not In  and other consultant's as instructed your Hospitalist MD ° °Please get a complete blood count and chemistry panel checked by your Primary MD at your next visit, and again as instructed by your Primary MD. ° °Get Medicines reviewed and adjusted: °Please take all your medications with you for your next visit with your Primary MD ° °Laboratory/radiological data: °Please request your Primary MD to go over all hospital tests and procedure/radiological results at the follow up, please ask your Primary MD to get all Hospital records sent to his/her office. ° °In some cases, they will be blood work, cultures and biopsy results pending at the time of your discharge. Please request that your primary care M.D. follows up on these results. ° °Also Note the following: °If you experience worsening of your admission symptoms, develop shortness of breath, life threatening emergency, suicidal or homicidal thoughts you must seek medical attention immediately by calling 911 or calling your MD immediately  if symptoms less severe. ° °You must read complete instructions/literature along with all the possible adverse reactions/side effects for all the Medicines you take and that have been prescribed to you. Take any new Medicines after you have completely understood and accpet all the possible adverse reactions/side effects.  ° °Do not drive when taking Pain medications or sleeping medications (Benzodaizepines) ° °Do not take more than prescribed Pain, Sleep and Anxiety Medications. It is not advisable to combine anxiety,sleep and pain medications without talking with your primary care practitioner ° °Special Instructions: If you have smoked or chewed Tobacco  in the last 2 yrs please stop smoking, stop any regular Alcohol  and or any Recreational drug use. ° °Wear Seat belts while driving. ° °Please note: °You were cared for by a hospitalist during your hospital stay. Once you are discharged,  your primary care physician will handle any further medical issues. Please note that NO REFILLS for any discharge medications will be authorized once you are discharged, as it is imperative that you return to your primary care physician (or establish a relationship with a primary care physician if you do not have one) for your post hospital discharge needs so that they can reassess your need for medications and monitor your lab values. ° ° ° ° °

## 2017-06-08 NOTE — Discharge Summary (Signed)
Physician Discharge Summary  Joy JohnConnie Janosik WGN:[id number] DOB: January 25, 1951 DOA: 06/07/2017  PCP: System, Pcp Not In  Admit date: 06/07/2017 Discharge date: 06/08/2017  Admitted From: HOME  Disposition: HOME   Recommendations for Outpatient Follow-up:  1. Follow up with PCP in 1 weeks 2. Please obtain BMP/CBC in one week 3. Go to scheduled testing for MRCP as previously arranged.   Home Health: N/A  Discharge Condition: STABLE  CODE STATUS: FULL    Brief Hospitalization Summary: Please see all hospital notes, images, labs for full details of the hospitalization.  HPI: Joy Mclaughlin is a very pleasant 66 y.o. female with medical history significant GERD, trigeminal neuralgia, diverticulosis, gall bladder disease this emergency department chief complaint persistent worsening abdominal pain. Initial evaluation includes CT of the abdomen pelvis revealing diverticulitis.  Information is obtained from the patient and the chart. She states she was in her usual state of health just returning from a [address] trip vacation she developed sudden right upper quadrant pain. She was evaluated at [facility] and told that there was mild sludge in her gallbladder. She was provided with oral antibiotics and discharged. MRI with MRCP scheduled for tomorrow. He was also instructed that if she spikes a temperature we'll refer pain got worse she needed to go to a bigger hospital. She said yesterday she was very tired she ate very little around 6 PM became feverish and had a chill. She reports her temperature was 101.9 associated symptoms include nausea without vomiting. She reports initially the pain was in her upper quadrants by the time she came to emergency department it was more in her lower quadrants. She describes a constant sharp aching. Nothing makes it better or worse. She denies headache dizziness syncope or near-syncope. She denies chest pain palpitations shortness of breath diaphoresis cough. She  denies dysuria hematuria frequency or urgency.  ED Course: In the emergency department her max temperature is 100.9 she's hemodynamically stable and not hypoxic. She is provided with 3 L of normal saline Cipro and Flagyl and Rocephin. At the time of my exam she reports no nausea and is pain-free  #1. Diverticulitis. CT of the abdomen findings consistent with diverticulitis of small bowel no drainable fluid collection or abscess, no bowel obstruction mildly distended gallbladder no calcified gallstones. She has a fever, WBC 14.4. Ultrasound of right upper quadrant unremarkable. Patient provided with IV fluids and antibiotics with good results and improvement in symptoms -Discharge on oral  Cipro and Flagyl to complete 10 days course -soft diet recommended and written information given to patient regarding this -regular stool softeners recommended  #2. Gallbladder disease/abnormal abdominal ultrasound at another facility. Patient had a right upper quadrant ultrasound yesterday at [facility] that revealed mild sludge. She was scheduled for a MRI with MRCP outpatient today which she was encouraged to go to or follow up with PCP to have rescheduled. Right upper quadrant ultrasound done in hospital was unremarkable. -Outpatient follow-up  #3. GERD. Stable at baseline -continue home meds  DVT prophylaxis: lovenox  Code Status: full  Family Communication: none present  Disposition Plan: home   Consults called: none  Admission status: obs   Discharge Diagnoses:  Principal Problem:   Diverticulitis Active Problems:   Obesity   Diverticulosis   Gallbladder disease   GERD (gastroesophageal reflux disease)   Trigeminal neuralgia   Fever  Discharge Instructions: Discharge Instructions    Call MD for:  extreme fatigue    Complete by:  As directed  Call MD for:  persistant dizziness or light-headedness    Complete by:  As directed    Call MD for:  persistant nausea and  vomiting    Complete by:  As directed    Call MD for:  severe uncontrolled pain    Complete by:  As directed    Call MD for:  temperature >100.4    Complete by:  As directed    Increase activity slowly    Complete by:  As directed      Allergies as of 06/08/2017   No Known Allergies     Medication List    TAKE these medications   carbamazepine 200 MG tablet Commonly known as:  TEGRETOL Take 200 mg by mouth every evening.   ciprofloxacin 500 MG tablet Commonly known as:  CIPRO Take 1 tablet (500 mg total) by mouth 2 (two) times daily.   CoQ10 200 MG Caps Take 200 mg by mouth every Tuesday, Thursday, and Saturday at 6 PM.   docusate sodium 100 MG capsule Commonly known as:  COLACE Take 1 capsule (100 mg total) by mouth 2 (two) times daily.   latanoprost 0.005 % ophthalmic solution Commonly known as:  XALATAN Place 1 drop into both eyes at bedtime.   metroNIDAZOLE 500 MG tablet Commonly known as:  FLAGYL Take 1 tablet (500 mg total) by mouth 3 (three) times daily.   multivitamin with minerals Tabs tablet Take 1 tablet by mouth every Monday, Wednesday, and Friday.   omega-3 acid ethyl esters 1 g capsule Commonly known as:  LOVAZA Take 1 g by mouth every Monday, Wednesday, and Friday.   omeprazole 20 MG capsule Commonly known as:  PRILOSEC Take 20 mg by mouth daily.   oxyCODONE 5 MG immediate release tablet Commonly known as:  Oxy IR/ROXICODONE Take 5 mg by mouth every 4 (four) hours as needed for moderate pain.   RED YEAST RICE PO Take 1,200 mg by mouth every Tuesday, Thursday, and Saturday at 6 PM.   VITAMIN B-12 PO Take 1 tablet by mouth daily.       No Known Allergies Current Discharge Medication List    START taking these medications   Details  ciprofloxacin (CIPRO) 500 MG tablet Take 1 tablet (500 mg total) by mouth 2 (two) times daily. Qty: 18 tablet, Refills: 0    docusate sodium (COLACE) 100 MG capsule Take 1 capsule (100 mg total) by mouth 2  (two) times daily. Qty: 100 capsule, Refills: 0    metroNIDAZOLE (FLAGYL) 500 MG tablet Take 1 tablet (500 mg total) by mouth 3 (three) times daily. Qty: 27 tablet, Refills: 0      CONTINUE these medications which have NOT CHANGED   Details  carbamazepine (TEGRETOL) 200 MG tablet Take 200 mg by mouth every evening.    Coenzyme Q10 (COQ10) 200 MG CAPS Take 200 mg by mouth every Tuesday, Thursday, and Saturday at 6 PM.    Cyanocobalamin (VITAMIN B-12 PO) Take 1 tablet by mouth daily.    latanoprost (XALATAN) 0.005 % ophthalmic solution Place 1 drop into both eyes at bedtime.    Multiple Vitamin (MULTIVITAMIN WITH MINERALS) TABS tablet Take 1 tablet by mouth every Monday, Wednesday, and Friday.    omega-3 acid ethyl esters (LOVAZA) 1 g capsule Take 1 g by mouth every Monday, Wednesday, and Friday.    omeprazole (PRILOSEC) 20 MG capsule Take 20 mg by mouth daily.    oxyCODONE (OXY IR/ROXICODONE) 5 MG immediate release tablet Take 5  mg by mouth every 4 (four) hours as needed for moderate pain.     Red Yeast Rice Extract (RED YEAST RICE PO) Take 1,200 mg by mouth every Tuesday, Thursday, and Saturday at 6 PM.        Procedures/Studies: Ct Abdomen Pelvis W Contrast  Result Date: 06/07/2017 CLINICAL DATA:  66 year old female with right-sided abdominal pain and vomiting. EXAM: CT ABDOMEN AND PELVIS WITH CONTRAST TECHNIQUE: Multidetector CT imaging of the abdomen and pelvis was performed using the standard protocol following bolus administration of intravenous contrast. CONTRAST:  ISOVUE-300 IOPAMIDOL (ISOVUE-300) INJECTION 61% COMPARISON:  None. FINDINGS: Lower chest: The visualized lung bases are clear. No intra-abdominal free air or free fluid. Hepatobiliary: The liver is unremarkable. The gallbladder is distended. No pericholecystic fluid. No calcified gallstone. Pancreas: Unremarkable. No pancreatic ductal dilatation or surrounding inflammatory changes. Spleen: Normal in size  without focal abnormality. Adrenals/Urinary Tract: The adrenal glands are unremarkable. There are small bilateral renal parapelvic cysts. There is no hydronephrosis on either side. There is symmetric enhancement and excretion of contrast by kidneys. The visualized ureters and urinary bladder appear unremarkable. Stomach/Bowel: There is a moderate size hiatal hernia. There are multiple duodenal diverticulum measuring up to 2.2 cm at the head of the pancreas no active inflammatory changes. There is inflammatory changes of the loops of small bowel in the mid abdomen. Multiple small bowel diverticula noted in this inflamed loops of small bowel. The inflammatory changes appear to arise from a small bowel diverticulum in the mid abdomen (series 3, image 54 and coronal series 6, image 51). There are scattered colonic diverticula without active inflammatory changes. There is no evidence of bowel obstruction. The appendix is normal. Vascular/Lymphatic: There is mild atherosclerotic calcification of the abdominal aorta. The origins of the celiac axis, SMA, and IMA appear patent. No portal venous gas identified. There is no adenopathy. The IVC appears unremarkable. Reproductive: The uterus is anteverted and grossly unremarkable as visualized. No pelvic mass. Other: None Musculoskeletal: There is degenerative changes of the spine with multilevel disc desiccation and vacuum phenomena. Grade 1 L4-5 anterolisthesis. No acute fracture. IMPRESSION: 1. Findings most consistent with diverticulitis of the small bowel in the mid abdomen. No drainable fluid collection or abscess. 2. Colonic diverticulosis without active inflammatory changes. No bowel obstruction. Normal appendix. 3. Moderate size hiatal hernia. 4. Mildly distended gallbladder.  No calcified gallstone. Electronically Signed   By: Elgie Collard M.D.   On: 06/07/2017 02:47   US Abdomen Limited Ruq  Result Date: 06/07/2017 CLINICAL DATA:  66 year old female with right  upper quadrant abdominal pain. EXAM: ULTRASOUND ABDOMEN LIMITED RIGHT UPPER QUADRANT COMPARISON:  Abdominal CT dated 06/07/2017 FINDINGS: Gallbladder: No gallstones or wall thickening visualized. No sonographic Murphy sign noted by sonographer. Common bile duct: Diameter: 3 mm Liver: No focal lesion identified. Within normal limits in parenchymal echogenicity. IMPRESSION: Unremarkable right upper quadrant ultrasound. Electronically Signed   By: Elgie Collard M.D.   On: 06/07/2017 03:10      Subjective: Pt feels much better, no fever, tolerating diet, no abd pain.  No chills.   Discharge Exam: Vitals:   06/07/17 2156 06/08/17 0520  BP: (!) 111/48 (!) 104/54  Pulse: 71 69  Resp: 18 17  Temp: 98.6 F (37 C) 98.5 F (36.9 C)   Vitals:   06/07/17 0611 06/07/17 1330 06/07/17 2156 06/08/17 0520  BP: (!) 123/49 (!) 105/50 (!) 111/48 (!) 104/54  Pulse: 81 75 71 69  Resp: 18 18 18  17  Temp: (!) 100.9 F (38.3 C) 99.7 F (37.6 C) 98.6 F (37 C) 98.5 F (36.9 C)  TempSrc: Oral Oral Oral   SpO2: 99% 96% 95% 96%  Weight: 103.2 kg (227 lb 8.2 oz)     Height: 5\' 2"  (1.575 m)       General: Pt is alert, awake, not in acute distress Cardiovascular: RRR, S1/S2 +, no rubs, no gallops Respiratory: CTA bilaterally, no wheezing, no rhonchi Abdominal: Soft, NT, ND, bowel sounds + Extremities: no edema, no cyanosis   The results of significant diagnostics from this hospitalization (including imaging, microbiology, ancillary and laboratory) are listed below for reference.     Microbiology: No results found for this or any previous visit (from the past 240 hour(s)).   Labs: BNP (last 3 results) No results for input(s): BNP in the last 8760 hours. Basic Metabolic Panel:  Recent Labs Lab 06/06/17 2002 06/08/17 0538  NA 136 139  K 4.4 3.4*  CL 103 109  CO2 27 23  GLUCOSE 131* 101*  BUN 11 6  CREATININE 0.92 0.81  CALCIUM 9.1 8.1*   Liver Function Tests:  Recent Labs Lab  06/06/17 2002 06/08/17 0538  AST 17 13*  ALT 13* 12*  ALKPHOS 68 54  BILITOT 0.5 0.3  PROT 6.6 5.7*  ALBUMIN 3.4* 2.5*    Recent Labs Lab 06/06/17 2002  LIPASE 24   No results for input(s): AMMONIA in the last 168 hours. CBC:  Recent Labs Lab 06/06/17 2002 06/08/17 0538  WBC 14.4* 8.5  HGB 12.5 10.6*  HCT 37.4 32.5*  MCV 88.2 88.3  PLT 314 237   Cardiac Enzymes: No results for input(s): CKTOTAL, CKMB, CKMBINDEX, TROPONINI in the last 168 hours. BNP: Invalid input(s): POCBNP CBG: No results for input(s): GLUCAP in the last 168 hours. D-Dimer No results for input(s): DDIMER in the last 72 hours. Hgb A1c No results for input(s): HGBA1C in the last 72 hours. Lipid Profile No results for input(s): CHOL, HDL, LDLCALC, TRIG, CHOLHDL, LDLDIRECT in the last 72 hours. Thyroid function studies No results for input(s): TSH, T4TOTAL, T3FREE, THYROIDAB in the last 72 hours.  Invalid input(s): FREET3 Anemia work up No results for input(s): VITAMINB12, FOLATE, FERRITIN, TIBC, IRON, RETICCTPCT in the last 72 hours. Urinalysis    Component Value Date/Time   COLORURINE YELLOW 06/06/2017 2000   APPEARANCEUR CLEAR 06/06/2017 2000   LABSPEC 1.011 06/06/2017 2000   PHURINE 7.0 06/06/2017 2000   GLUCOSEU NEGATIVE 06/06/2017 2000   HGBUR NEGATIVE 06/06/2017 2000   BILIRUBINUR NEGATIVE 06/06/2017 2000   KETONESUR NEGATIVE 06/06/2017 2000   PROTEINUR NEGATIVE 06/06/2017 2000   NITRITE NEGATIVE 06/06/2017 2000   LEUKOCYTESUR NEGATIVE 06/06/2017 2000   Sepsis Labs Invalid input(s): PROCALCITONIN,  WBC,  LACTICIDVEN Microbiology No results found for this or any previous visit (from the past 240 hour(s)).  Time coordinating discharge:   SIGNED:  Standley Dakins, MD  [facility] 06/08/2017, 10:58 AM Pager [phone]  If 7PM-7AM, please contact night-coverage [email or web address] Password [email or web address]

## 2017-06-08 NOTE — Progress Notes (Signed)
Discharge home. Home discharge instruction given, no question verbalized. 

## 2017-06-28 ENCOUNTER — Other Ambulatory Visit: Payer: Self-pay | Admitting: Obstetrics and Gynecology

## 2017-06-28 DIAGNOSIS — Z1231 Encounter for screening mammogram for malignant neoplasm of breast: Secondary | ICD-10-CM

## 2017-07-15 ENCOUNTER — Ambulatory Visit
Admission: RE | Admit: 2017-07-15 | Discharge: 2017-07-15 | Disposition: A | Discharge status: Home / Self Care | Payer: BC Managed Care – PPO | Source: Ambulatory Visit | Attending: Obstetrics and Gynecology | Admitting: Obstetrics and Gynecology

## 2017-07-15 ENCOUNTER — Encounter: Payer: Self-pay | Admitting: Radiology

## 2017-07-15 DIAGNOSIS — Z1231 Encounter for screening mammogram for malignant neoplasm of breast: Secondary | ICD-10-CM | POA: Diagnosis not present

## 2018-06-29 ENCOUNTER — Other Ambulatory Visit: Payer: Self-pay | Admitting: Obstetrics and Gynecology

## 2018-06-29 DIAGNOSIS — Z1231 Encounter for screening mammogram for malignant neoplasm of breast: Secondary | ICD-10-CM

## 2018-08-01 ENCOUNTER — Ambulatory Visit
Admission: RE | Admit: 2018-08-01 | Discharge: 2018-08-01 | Disposition: A | Discharge status: Home / Self Care | Payer: BC Managed Care – PPO | Source: Ambulatory Visit | Attending: Obstetrics and Gynecology | Admitting: Obstetrics and Gynecology

## 2018-08-01 DIAGNOSIS — Z1231 Encounter for screening mammogram for malignant neoplasm of breast: Secondary | ICD-10-CM | POA: Diagnosis not present

## 2019-09-04 ENCOUNTER — Other Ambulatory Visit: Payer: Self-pay | Admitting: Obstetrics and Gynecology

## 2019-09-04 DIAGNOSIS — Z1231 Encounter for screening mammogram for malignant neoplasm of breast: Secondary | ICD-10-CM

## 2019-11-28 ENCOUNTER — Ambulatory Visit
Admission: RE | Admit: 2019-11-28 | Discharge: 2019-11-28 | Disposition: A | Discharge status: Home / Self Care | Payer: Medicare PPO | Source: Ambulatory Visit | Attending: Obstetrics and Gynecology | Admitting: Obstetrics and Gynecology

## 2019-11-28 DIAGNOSIS — Z1231 Encounter for screening mammogram for malignant neoplasm of breast: Secondary | ICD-10-CM | POA: Diagnosis present

## 2020-12-18 ENCOUNTER — Other Ambulatory Visit: Payer: Self-pay | Admitting: Obstetrics and Gynecology

## 2020-12-18 DIAGNOSIS — Z1231 Encounter for screening mammogram for malignant neoplasm of breast: Secondary | ICD-10-CM

## 2021-01-08 ENCOUNTER — Other Ambulatory Visit: Payer: Self-pay

## 2021-01-08 ENCOUNTER — Ambulatory Visit
Admission: RE | Admit: 2021-01-08 | Discharge: 2021-01-08 | Disposition: A | Discharge status: Home / Self Care | Payer: Medicare PPO | Source: Ambulatory Visit | Attending: Obstetrics and Gynecology | Admitting: Obstetrics and Gynecology

## 2021-01-08 DIAGNOSIS — Z1231 Encounter for screening mammogram for malignant neoplasm of breast: Secondary | ICD-10-CM | POA: Diagnosis present

## 2021-12-24 ENCOUNTER — Other Ambulatory Visit: Payer: Self-pay | Admitting: Obstetrics and Gynecology

## 2021-12-24 DIAGNOSIS — Z1231 Encounter for screening mammogram for malignant neoplasm of breast: Secondary | ICD-10-CM

## 2022-02-02 ENCOUNTER — Other Ambulatory Visit: Payer: Self-pay

## 2022-02-02 ENCOUNTER — Ambulatory Visit
Admission: RE | Admit: 2022-02-02 | Discharge: 2022-02-02 | Disposition: A | Discharge status: Home / Self Care | Payer: Medicare PPO | Source: Ambulatory Visit | Attending: Obstetrics and Gynecology | Admitting: Obstetrics and Gynecology

## 2022-02-02 DIAGNOSIS — Z1231 Encounter for screening mammogram for malignant neoplasm of breast: Secondary | ICD-10-CM | POA: Insufficient documentation

## 2022-11-04 IMAGING — MG MM DIGITAL SCREENING BILAT W/ TOMO AND CAD
6 of 12 series · 6 of 36 positions shown · non-contrast
Comparison: Previous exam(s).

CLINICAL DATA: Screening.

EXAM:
DIGITAL SCREENING BILATERAL MAMMOGRAM WITH TOMOSYNTHESIS AND CAD
TECHNIQUE: Bilateral screening digital craniocaudal and mediolateral oblique
mammograms were obtained. Bilateral screening digital breast
tomosynthesis was performed. The images were evaluated with
computer-aided detection.

[R MLO synth-2D]
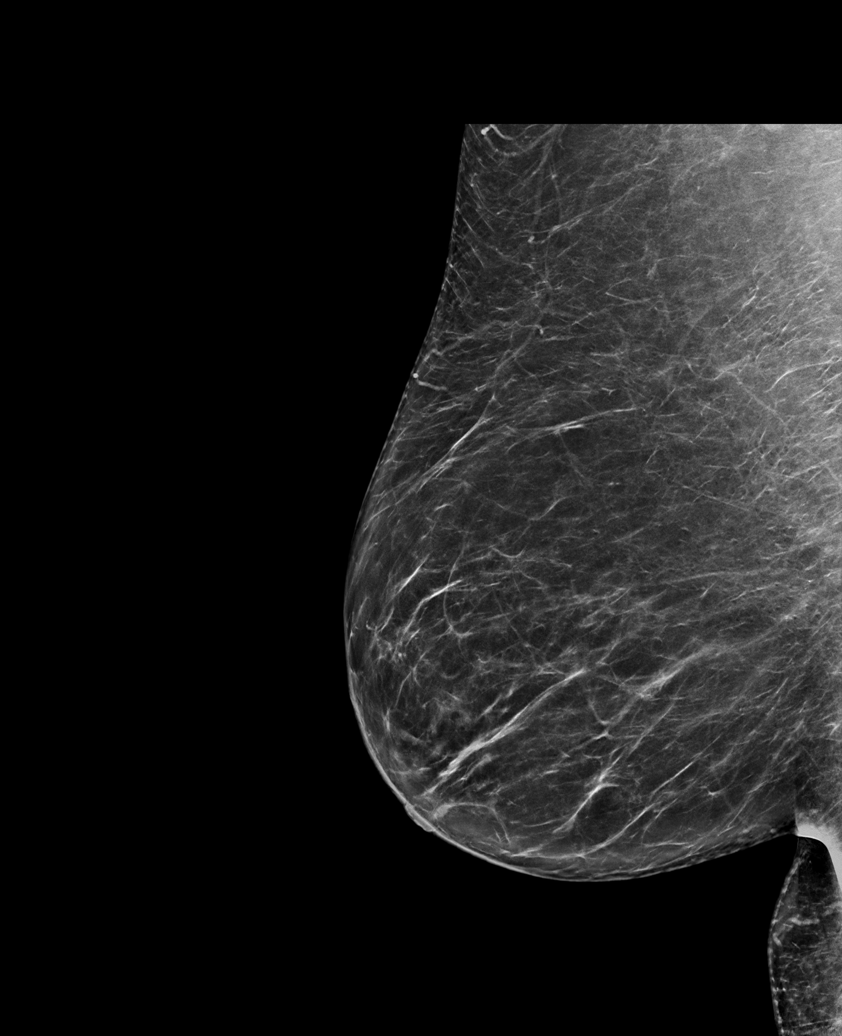

[L CC synth-2D (1 of 2)]
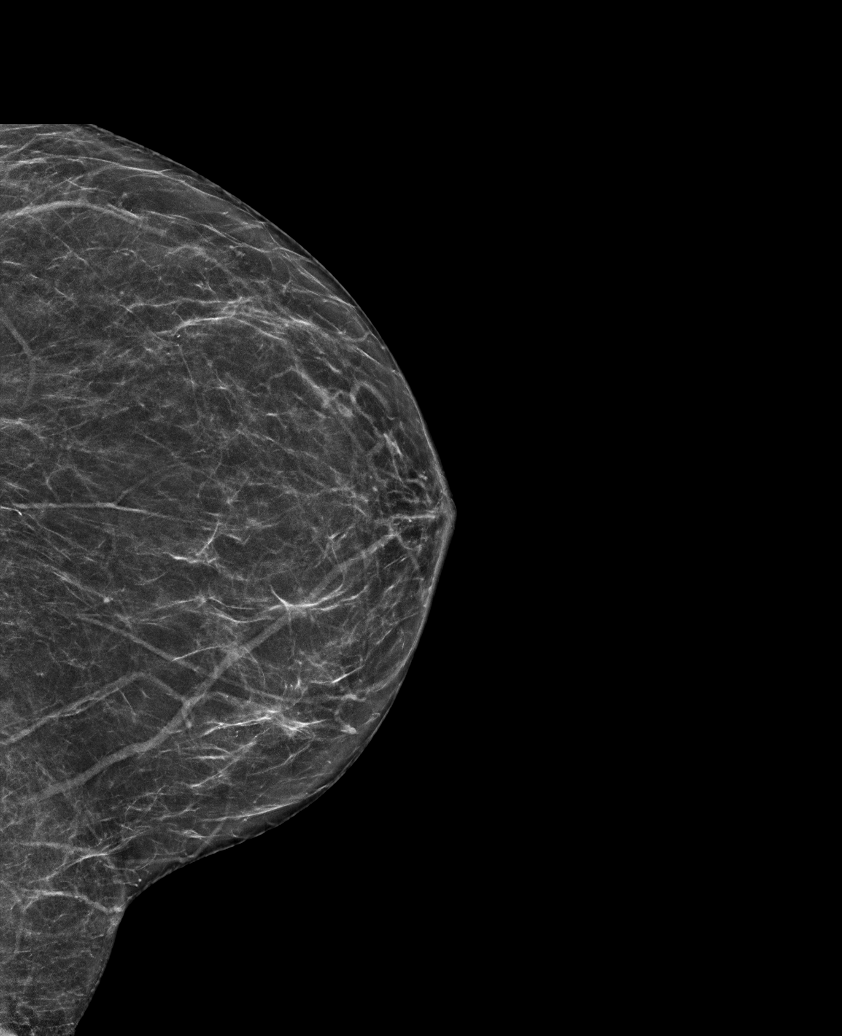

[R CC synth-2D (1 of 2)]
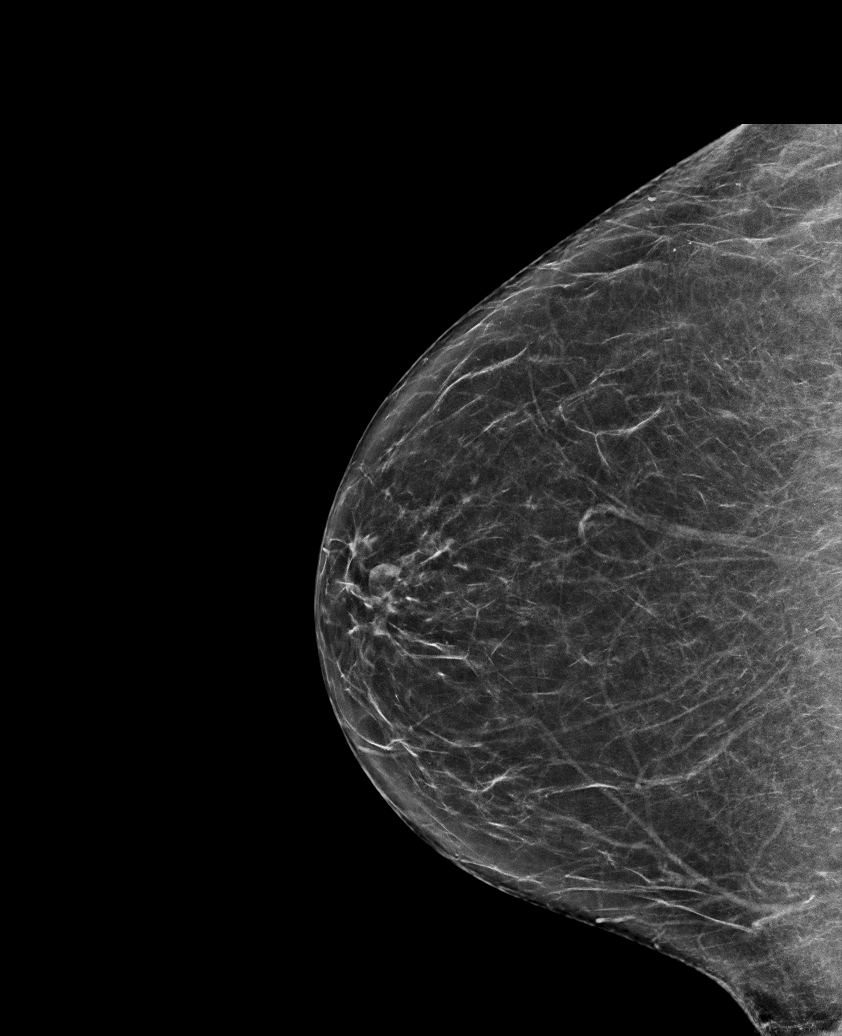

[L MLO synth-2D]
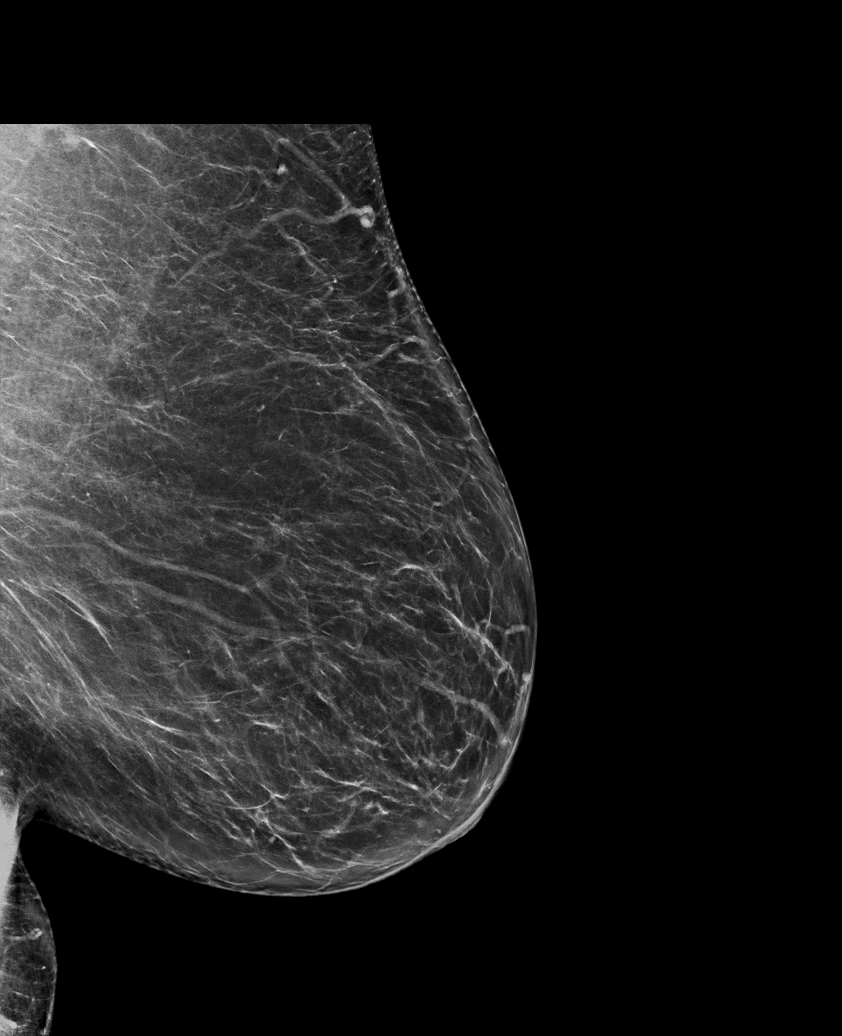

[L CC synth-2D (2 of 2)]
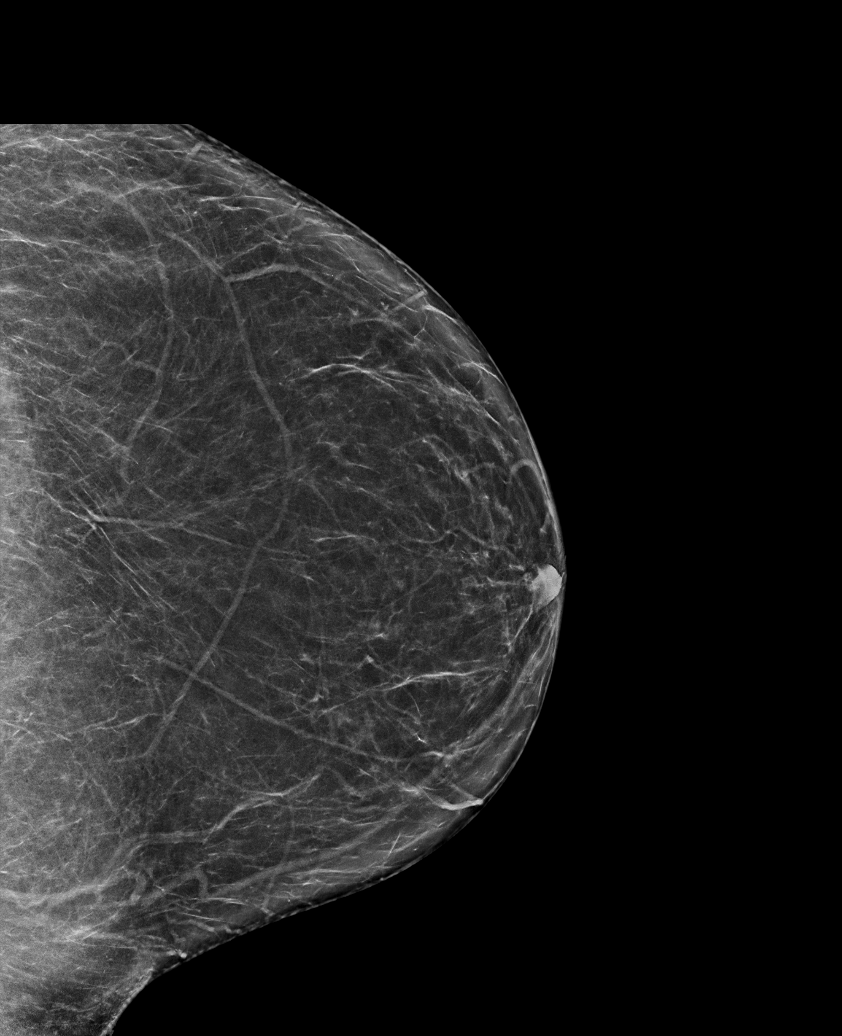

[R CC synth-2D (2 of 2)]
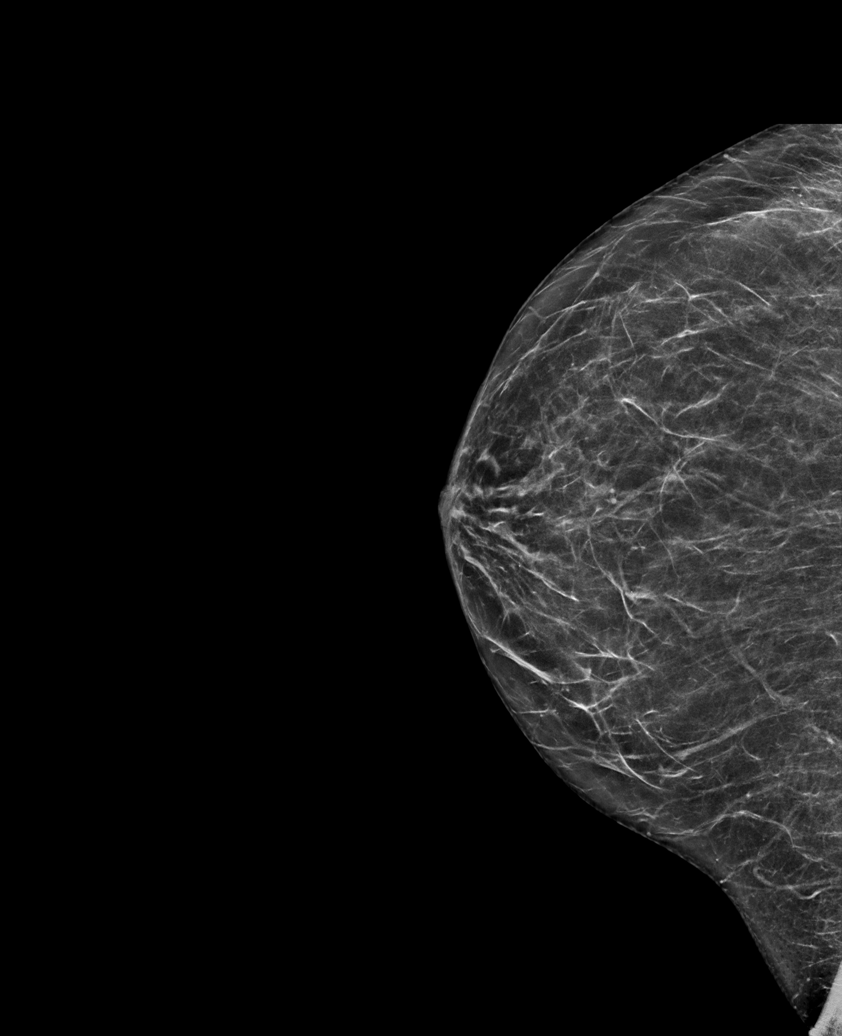

[6 of 36 positions shown; findings below may reference images not displayed]

ACR Breast Density Category b: There are scattered areas of
fibroglandular density.
FINDINGS: There are no findings suspicious for malignancy.
IMPRESSION: No mammographic evidence of malignancy. A result letter of this
screening mammogram will be mailed directly to the patient.

RECOMMENDATION:
Screening mammogram in one year. (Code:51-O-LD2)

BI-RADS CATEGORY  1: Negative.

## 2022-12-20 ENCOUNTER — Other Ambulatory Visit: Payer: Self-pay | Admitting: Obstetrics and Gynecology

## 2022-12-20 DIAGNOSIS — Z1231 Encounter for screening mammogram for malignant neoplasm of breast: Secondary | ICD-10-CM

## 2023-02-16 ENCOUNTER — Ambulatory Visit
Admission: RE | Admit: 2023-02-16 | Discharge: 2023-02-16 | Disposition: A | Discharge status: Home / Self Care | Payer: Medicare PPO | Source: Ambulatory Visit | Attending: Obstetrics and Gynecology | Admitting: Obstetrics and Gynecology

## 2023-02-16 DIAGNOSIS — Z1231 Encounter for screening mammogram for malignant neoplasm of breast: Secondary | ICD-10-CM | POA: Diagnosis present

## 2024-01-17 ENCOUNTER — Other Ambulatory Visit: Payer: Self-pay | Admitting: Obstetrics and Gynecology

## 2024-01-17 DIAGNOSIS — Z1231 Encounter for screening mammogram for malignant neoplasm of breast: Secondary | ICD-10-CM

## 2024-02-28 ENCOUNTER — Ambulatory Visit
Admission: RE | Admit: 2024-02-28 | Discharge: 2024-02-28 | Disposition: A | Discharge status: Home / Self Care | Source: Ambulatory Visit | Attending: Obstetrics and Gynecology | Admitting: Obstetrics and Gynecology

## 2024-02-28 DIAGNOSIS — Z1231 Encounter for screening mammogram for malignant neoplasm of breast: Secondary | ICD-10-CM | POA: Insufficient documentation
